# Patient Record
Sex: Male | Born: 1990 | Race: White | Hispanic: No | Marital: Single | Smoking: Never smoker
Health system: Southern US, Community
[De-identification: ages and names within clinical notes are randomized; demographics above are authoritative.]

## PROBLEM LIST (undated history)

## (undated) DIAGNOSIS — Q792 Exomphalos: Secondary | ICD-10-CM

## (undated) HISTORY — DX: Exomphalos: Q79.2

---
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
## 2011-01-17 NOTE — Letter (Signed)
Summary: Out of School  [place] at [facility]  [address] [place], [place] [address]   Phone: [phone]  Fax: [phone]    February 04, 2010   Student:  Greg Cutter    To Whom It May Concern:   Shia Eber was seen in our office today.  Please excuse him.  If you need additional information, please feel free to contact our office.   Sincerely,    Ruthe Mannan MD    ****This is a legal document and cannot be tampered with.  Schools are authorized to verify all information and to do so accordingly.

## 2011-01-17 NOTE — Letter (Signed)
Summary: Sport Preparticipation Form/[employer] Preparticipation Form/[employer]   Imported By: Lanelle Bal 02/14/2010 13:33:52  _____________________________________________________________________  External Attachment:    Type:   Image     Comment:   External Document

## 2011-01-17 NOTE — Letter (Signed)
Summary: Immunization Verification [employer]  Immunization Verification [facility]   Imported By: Lanelle Bal 02/08/2010 08:42:09  _____________________________________________________________________  External Attachment:    Type:   Image     Comment:   External Document

## 2011-01-17 NOTE — Assessment & Plan Note (Signed)
Summary: BRONCHITIS/DLO   Vital Signs:  Patient profile:   20 year old male Height:      71 inches Weight:      189.13 pounds BMI:     26.47 Temp:     97.8 degrees F oral Pulse rate:   80 / minute Pulse rhythm:   regular BP sitting:   132 / 82  (left arm) Cuff size:   regular  Vitals Entered By: Delilah Shan CMA [facility] Dull) (February 04, 2010 10:42 AM) CC: ? bronchitis   History of Present Illness: 20 yo with 1 1/2 weeks of runny nose, sneezing, subjective fevers. All symptoms resolved but dad wanted him checked out because at [facility] practice earlier this week, he was more winded.  Was fine yesterday and today. No wheezing  or chest pain. No longer coughing or having other URI symptoms. No n/v/d. No rashes.  Current Medications (verified): 1)  None  Allergies (verified): No Known Drug Allergies  Review of Systems      See HPI General:  Denies chills and fever. ENT:  Complains of nasal congestion and postnasal drainage. Resp:  Complains of cough.  Physical Exam  General:  Well appearing adolescent,no acute distress Ears:  TM's pearly gray with cone, canals clear  Nose:  Clear without erythema, edema or exudate  Mouth:  Erythema , no exudate Lungs:  Clear to ausc, no crackles, rhonchi or wheezing, no grunting, flaring or retractions  Heart:  RRR without murmur  Extremities:  no edema Cervical Nodes:  No lymphadenopathy noted Psych:  Cognition and judgment appear intact. Alert and cooperative with normal attention span and concentration. No apparent delusions, illusions, hallucinations   Impression & Recommendations:  Problem # 1:  URI (ICD-465.9) Assessment New Likely viral with resolving symptoms. No concerning finding on physical exam. Continue supportive care as needed with decongestants, NSAIDs, but currently asymptomatic so advised doing nothing at this point.  Prior Medications (reviewed today): None Current Allergies (reviewed today): No known  allergies

## 2011-01-17 NOTE — Progress Notes (Signed)
Summary: ? Records  Phone Note Call from Patient Call back at [phone]   Caller: Mom Call For: Cindee Salt MD Summary of Call: Mom called to make sure that her son's records were up to date here with his immunizations. They are getting ready to start college applications and wants to make sure that everything is here to have the forms completed. Initial call taken by: Sydell Axon LPN,  December 30, 2009 4:21 PM  Follow-up for Phone Call        left message on machine that immunizations were up to date, but that his college application may ask for something different. Follow-up by: Mervin Hack CMA [facility] Dull),  December 31, 2009 10:06 AM

## 2013-03-18 HISTORY — PX: HERNIA REPAIR: SHX51

## 2013-05-28 ENCOUNTER — Encounter: Payer: Self-pay | Admitting: Internal Medicine

## 2013-05-28 ENCOUNTER — Encounter: Payer: Self-pay | Admitting: *Deleted

## 2013-05-28 ENCOUNTER — Ambulatory Visit (INDEPENDENT_AMBULATORY_CARE_PROVIDER_SITE_OTHER): Payer: BC Managed Care – PPO | Admitting: Internal Medicine

## 2013-05-28 VITALS — BP 110/70 | HR 91 | Temp 98.4°F | Wt 187.0 lb

## 2013-05-28 DIAGNOSIS — R1013 Epigastric pain: Secondary | ICD-10-CM

## 2013-05-28 NOTE — Progress Notes (Signed)
  Subjective:    Patient ID: Andrew Perry, male    DOB: 1991/09/20, 22 y.o.   MRN: [id number]  HPI Here with girlfriend  2 days ago at work at [employer] with sharp pains in epigastric pain Felt like gas bubble---but wasn't able to burp it away Worsened yesterday---intermittent sharp pain Loose stools yesterday-- 6-7 spells. No blood Stomach "uneasy" but no vomiting Spells of pain severe for 15 seconds or so--- recurring if he moved around and may have been worse after eating  Had surgery on 4/22--- for correction of previous omphalocele repair (cosmetic purposes but then needed inside work) Has extensive mesh there Some adhesions were lysed Was cleared by the surgeon now Took 12-13 days post op to move bowels and was hospitalized for 17 days  Now feels his pain "is moving down" Seems to be better after moving bowels today--was soft but not watery like yesterday  No current outpatient prescriptions on file prior to visit.   No current facility-administered medications on file prior to visit.    Not on File  Past Medical History  Diagnosis Date  . 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  Family History  Problem Relation Age of Onset  . Healthy Mother   . Healthy Father   . CAD Maternal Grandfather     History   Social History  . Marital Status: Single    Spouse Name: N/A    Number of Children: N/A  . Years of Education: N/A   Occupational History  . Not on file.   Social History Main Topics  . Smoking status: Never Smoker   . Smokeless tobacco: Never Used  . Alcohol Use: No  . Drug Use: No  . Sexually Active: Not on file   Other Topics Concern  . Not on file   Social History Narrative   Dad is [employer] Facilities manager   Mom is audiologist   Younger sister   Living with parents   Part way through college--communication   Review of Systems Usually moves his bowels 2-3 times per day since getting going post  op Appetite is fine now--just cautious about eating No fever No exposure to GI illness No cough or SOB No NSAID use (or rare)    Objective:   Physical Exam  Constitutional: He appears well-developed. No distress.  Neck: Normal range of motion. Neck supple. No thyromegaly present.  Cardiovascular: Normal rate, regular rhythm and normal heart sounds.  Exam reveals no gallop.   No murmur heard. Pulmonary/Chest: Effort normal and breath sounds normal. No respiratory distress. He has no wheezes. He has no rales.  Abdominal: Soft. Bowel sounds are normal. He exhibits no distension and no mass. There is no tenderness. There is no rebound and no guarding.  Healing midline incision  Lymphadenopathy:    He has no cervical adenopathy.          Assessment & Plan:

## 2013-05-28 NOTE — Assessment & Plan Note (Signed)
Nothing to suggest infectious process Doubt gastritis or gallbladder disease  Probably partial obstruction from adhesion Better now Had many adhesions lysed but could have new one---hopefully won't be recurrent but discussed Recommended supine posture with legs up if recurrence Would set up follow up with surgeons for reeval if recurs

## 2013-09-20 ENCOUNTER — Emergency Department (HOSPITAL_COMMUNITY)
Admission: EM | Admit: 2013-09-20 | Discharge: 2013-09-21 | Disposition: A | Discharge status: Home / Self Care | Payer: BC Managed Care – PPO | Attending: Emergency Medicine | Admitting: Emergency Medicine

## 2013-09-20 ENCOUNTER — Encounter (HOSPITAL_COMMUNITY): Payer: Self-pay | Admitting: Family Medicine

## 2013-09-20 ENCOUNTER — Emergency Department (HOSPITAL_COMMUNITY): Payer: BC Managed Care – PPO

## 2013-09-20 DIAGNOSIS — S161XXA Strain of muscle, fascia and tendon at neck level, initial encounter: Secondary | ICD-10-CM

## 2013-09-20 DIAGNOSIS — R413 Other amnesia: Secondary | ICD-10-CM | POA: Insufficient documentation

## 2013-09-20 DIAGNOSIS — S335XXA Sprain of ligaments of lumbar spine, initial encounter: Secondary | ICD-10-CM | POA: Insufficient documentation

## 2013-09-20 DIAGNOSIS — IMO0002 Reserved for concepts with insufficient information to code with codable children: Secondary | ICD-10-CM | POA: Insufficient documentation

## 2013-09-20 DIAGNOSIS — S39012A Strain of muscle, fascia and tendon of lower back, initial encounter: Secondary | ICD-10-CM

## 2013-09-20 DIAGNOSIS — Y939 Activity, unspecified: Secondary | ICD-10-CM | POA: Insufficient documentation

## 2013-09-20 DIAGNOSIS — S139XXA Sprain of joints and ligaments of unspecified parts of neck, initial encounter: Secondary | ICD-10-CM | POA: Insufficient documentation

## 2013-09-20 MED ORDER — HYDROMORPHONE HCL PF 1 MG/ML IJ SOLN
1.0000 mg | Freq: Once | INTRAMUSCULAR | Status: AC
  Administered 2013-09-21: 1 mg via INTRAVENOUS
  Filled 2013-09-20: qty 1

## 2013-09-20 MED ORDER — MORPHINE SULFATE 4 MG/ML IJ SOLN
4.0000 mg | Freq: Once | INTRAMUSCULAR | Status: AC
  Administered 2013-09-20: 4 mg via INTRAVENOUS
  Filled 2013-09-20: qty 1

## 2013-09-20 MED ORDER — TETANUS-DIPHTH-ACELL PERTUSSIS 5-2.5-18.5 LF-MCG/0.5 IM SUSP
0.5000 mL | Freq: Once | INTRAMUSCULAR | Status: AC
  Administered 2013-09-21: 0.5 mL via INTRAMUSCULAR
  Filled 2013-09-20: qty 0.5

## 2013-09-20 MED ORDER — ONDANSETRON HCL 4 MG/2ML IJ SOLN
4.0000 mg | Freq: Once | INTRAMUSCULAR | Status: AC
  Administered 2013-09-20: 4 mg via INTRAVENOUS
  Filled 2013-09-20: qty 2
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
## 2013-09-21 MED ORDER — METHOCARBAMOL 500 MG PO TABS: 500.0000 mg | ORAL_TABLET | Freq: Two times a day (BID) | ORAL | Status: AC

## 2013-09-21 MED ORDER — NAPROXEN 500 MG PO TABS: 500.0000 mg | ORAL_TABLET | Freq: Two times a day (BID) | ORAL | Status: AC

## 2013-09-21 NOTE — ED Provider Notes (Signed)
Medical screening examination/treatment/procedure(s) were performed by non-physician practitioner and as supervising physician I was immediately available for consultation/collaboration.   Hurman Horn, MD 09/21/13 1239

## 2013-09-25 NOTE — ED Provider Notes (Signed)
Medical screening examination/treatment/procedure(s) were performed by non-physician practitioner and as supervising physician I was immediately available for consultation/collaboration.  Hurman Horn, MD 09/25/13 2129
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
## 2016-07-25 ENCOUNTER — Ambulatory Visit (INDEPENDENT_AMBULATORY_CARE_PROVIDER_SITE_OTHER): Payer: BLUE CROSS/BLUE SHIELD | Admitting: Family Medicine

## 2016-07-25 ENCOUNTER — Encounter: Payer: Self-pay | Admitting: Family Medicine

## 2016-07-25 VITALS — BP 132/91 | HR 133 | Temp 98.5°F | Wt 203.8 lb

## 2016-07-25 DIAGNOSIS — J029 Acute pharyngitis, unspecified: Secondary | ICD-10-CM

## 2016-07-25 LAB — POCT RAPID STREP A (OFFICE): Rapid Strep A Screen: NEGATIVE

## 2016-07-25 NOTE — Patient Instructions (Addendum)
Use Ibuprofen 800 mg three times daily as needed.  Chloraseptic spray as well.  Follow up as needed.  We will call with the culture results.  Take care   Dr. Adriana Simasook

## 2016-07-25 NOTE — Assessment & Plan Note (Addendum)
New acute problem. Rapid strep negative today. Sending for culture. Advised over-the-counter ibuprofen/Tylenol and Chloraseptic spray.

## 2016-07-25 NOTE — Progress Notes (Signed)
   Subjective:  Patient ID: Greg CutterGarrett Schonberg, male    DOB: 01-Mar-1991  Age: 25 y.o. MRN: [id number]  CC: Sore Throat  HPI:  25 year old male presents for an acute visit with complaints of sore throat.  Patient states these had sore throat for the past 4 days. It has been worsening over the past 2 days. He states that he has had some improvement as of today. He reports associated cough and headache. No fever. He has felt hot. No chills. He's taking DayQuil without significant improvement following that. No other medications or interventions tried. No reported sick contacts. No known exacerbating factors. No other complaints this time.  Social Hx   Social History   Social History  . Marital status: Single    Spouse name: N/A  . Number of children: N/A  . Years of education: N/A   Social History Main Topics  . Smoking status: Never Smoker  . Smokeless tobacco: Never Used  . Alcohol use Yes     Comment: very rarely  . Drug use: No  . Sexual activity: Not Asked   Other Topics Concern  . None   Social History Narrative   Dad is [place] officer   Mom is audiologist   Younger sister   Living with parents   Part way through college--communication   Review of Systems  Constitutional: Negative for fever.  HENT: Positive for sore throat.   Respiratory: Positive for cough.   Neurological: Positive for headaches.   Objective:  BP (!) 132/91 (BP Location: Left Arm, Patient Position: Sitting, Cuff Size: Normal)   Pulse (!) 133   Temp 98.5 F (36.9 C) (Oral)   Wt 203 lb 12 oz (92.4 kg)   SpO2 100%   BMI 26.88 kg/m   BP/Weight 07/25/2016 09/21/2013 09/20/2013  Systolic BP 132 124 -  Diastolic BP 91 72 -  Wt. (Lbs) 203.75 - 190  BMI 26.88 - 25.07   Physical Exam  Constitutional: He is oriented to person, place, and time. He appears well-developed. No distress.  HENT:  Oropharynx with moderate to severe erythema. No tonsillar exudate.  Neck: Neck supple.    Cardiovascular:  Tachycardia. Regular rhythm.  Pulmonary/Chest: Effort normal. He has no wheezes. He has no rales.  Lymphadenopathy:    He has no cervical adenopathy.  Neurological: He is alert and oriented to person, place, and time.  Psychiatric: He has a normal mood and affect.  Vitals reviewed.  Assessment & Plan:   Problem List Items Addressed This Visit    Acute pharyngitis - Primary    New acute problem. Rapid strep negative today. Sending for culture. Advised over-the-counter ibuprofen/Tylenol and Chloraseptic spray.      Relevant Orders   POCT rapid strep A (Completed)   Culture, Group A Strep    Other Visit Diagnoses   None.    Follow-up: PRN  Everlene OtherJayce Jammy Stlouis DO [facility] [place]

## 2016-07-25 NOTE — Progress Notes (Signed)
Pre visit review using our clinic review tool, if applicable. No additional management support is needed unless otherwise documented below in the visit note. 

## 2016-07-27 LAB — CULTURE, GROUP A STREP: Organism ID, Bacteria: NORMAL
